# Patient Record
Sex: Female | Born: 1985
Health system: Southern US, Community
[De-identification: ages and names within clinical notes are randomized; demographics above are authoritative.]

---

## 2020-02-02 ENCOUNTER — Emergency Department (HOSPITAL_BASED_OUTPATIENT_CLINIC_OR_DEPARTMENT_OTHER)
Admission: EM | Admit: 2020-02-02 | Discharge: 2020-02-02 | Disposition: A | Discharge status: Home / Self Care | Payer: BC Managed Care – PPO | Attending: Emergency Medicine | Admitting: Emergency Medicine

## 2020-02-02 ENCOUNTER — Other Ambulatory Visit (HOSPITAL_BASED_OUTPATIENT_CLINIC_OR_DEPARTMENT_OTHER): Payer: Self-pay | Admitting: Physician Assistant

## 2020-02-02 ENCOUNTER — Emergency Department (HOSPITAL_BASED_OUTPATIENT_CLINIC_OR_DEPARTMENT_OTHER): Payer: BC Managed Care – PPO

## 2020-02-02 ENCOUNTER — Other Ambulatory Visit: Payer: Self-pay

## 2020-02-02 ENCOUNTER — Encounter (HOSPITAL_BASED_OUTPATIENT_CLINIC_OR_DEPARTMENT_OTHER): Payer: Self-pay | Admitting: *Deleted

## 2020-02-02 DIAGNOSIS — R059 Cough, unspecified: Secondary | ICD-10-CM | POA: Diagnosis not present

## 2020-02-02 MED ORDER — BENZONATATE 100 MG PO CAPS: 100.0000 mg | ORAL_CAPSULE | Freq: Three times a day (TID) | ORAL | 0 refills | Status: DC | PRN

## 2020-02-02 MED ORDER — PREDNISONE 10 MG PO TABS: 20.0000 mg | ORAL_TABLET | Freq: Every day | ORAL | 0 refills | Status: DC

## 2020-02-02 MED ORDER — ALBUTEROL SULFATE HFA 108 (90 BASE) MCG/ACT IN AERS
1.0000 | INHALATION_SPRAY | Freq: Once | RESPIRATORY_TRACT | Status: AC
  Administered 2020-02-02: 1 via RESPIRATORY_TRACT
  Filled 2020-02-02: qty 6.7

## 2020-02-02 MED ORDER — PREDNISONE 50 MG PO TABS
60.0000 mg | ORAL_TABLET | Freq: Once | ORAL | Status: AC
  Administered 2020-02-02: 60 mg via ORAL
  Filled 2020-02-02: qty 1

## 2020-02-02 MED FILL — predniSONE 10 MG TABS: 10 | 5 days supply | Qty: 10 | Fill #0

## 2020-02-02 MED FILL — BENZONATATE 100 MG CAPS: 100 | 7 days supply | Qty: 21 | Fill #0

## 2020-02-02 NOTE — Discharge Instructions (Signed)
Your chest x-ray did not show any signs of infection or pneumonia.  It is likely you have bronchitis as we discussed.  Prescription sent to your pharmacy for Tessalon.  This is a cough medicine that can help decrease coughing.  Take as needed.  Prescription also sent for a short burst of prednisone.  Take as prescribed.  Return to the emergency room you have any worsening symptoms.  Thank you for allowing [place] to care for you today.

## 2020-02-02 NOTE — ED Triage Notes (Signed)
Cough x 4 weeks. She has had 3 negative Covid test. The last test was 3 days ago.

## 2020-02-02 NOTE — ED Provider Notes (Signed)
[facility] EMERGENCY DEPARTMENT Provider Note   CSN: [id number] Arrival date & time: 02/02/20  1052     History Chief Complaint  Patient presents with  . Cough    Sandra Cole is a 34 y.o. female with self reported past medical history of asthma.  Had COVID vaccinations   HPI Patient presents to emergency department today with chief complaint of cough x4 weeks.  Patient states the cough first started after she went on a work trip.  She states the cough is dry nonproductive.  She has has tried over-the-counter medications without much improvement.  She states when she takes Mucinex it just makes her cough more.  She states she typically gets bronchitis every year.  She does state after she has a long coughing episode she will have sharp pain in her chest.  The chest pain is not worse with exertion.  She denies any associated shortness of breath or dyspnea on exertion.  She has had multiple negative Covid test, her last test was 3 days ago.  She states her asthma is typically well controlled.  She has noticed intermittent wheezing however denies being any yesterday or today.  She denies any fever, chills, congestion, sore throat, otalgia, abdominal pain, nausea emesis, rash.  Denies any sick contacts or known Covid exposures.       History reviewed. No pertinent past medical history.  There are no problems to display for this patient.   History reviewed. No pertinent surgical history.   OB History   No obstetric history on file.     No family history on file.  Social History   Tobacco Use  . Smoking status: Never Smoker  . Smokeless tobacco: Never Used  Substance Use Topics  . Alcohol use: Yes  . Drug use: Never    Home Medications Prior to Admission medications   Medication Sig Start Date End Date Taking? Authorizing Provider  benzonatate (TESSALON) 100 MG capsule Take 1 capsule (100 mg total) by mouth every 8 (eight) hours as needed for cough. 02/02/20    Walisiewicz, Yvonna Alanis E, PA-C  predniSONE (DELTASONE) 10 MG tablet Take 2 tablets (20 mg total) by mouth daily for 5 days. 02/03/20 02/08/20  Shanon Ace, PA-C    Allergies    Patient has no known allergies.  Review of Systems   Review of Systems All other systems are reviewed and are negative for acute change except as noted in the HPI.  Physical Exam Updated Vital Signs BP (!) 129/93 (BP Location: Right Arm)   Pulse 86   Temp 98.2 F (36.8 C) (Oral)   Resp 18   Ht 5\' 1"  (1.549 m)   Wt 63.5 kg   SpO2 100%   BMI 26.47 kg/m   Physical Exam Vitals and nursing note reviewed.  Constitutional:      Appearance: She is well-developed. She is not ill-appearing or toxic-appearing.     Comments: Dry hacking cough during exam  HENT:     Head: Normocephalic and atraumatic.     Comments: No sinus or temporal tenderness.    Nose: Nose normal.     Mouth/Throat:     Comments: No erythema to oropharynx, no edema, no exudate, no tonsillar swelling, voice normal, neck supple without lymphadenopathy  Eyes:     General: No scleral icterus.       Right eye: No discharge.        Left eye: No discharge.     Conjunctiva/sclera: Conjunctivae normal.  Neck:     Vascular: No JVD.  Cardiovascular:     Rate and Rhythm: Normal rate and regular rhythm.     Pulses: Normal pulses.     Heart sounds: Normal heart sounds.  Pulmonary:     Effort: Pulmonary effort is normal. No respiratory distress.     Breath sounds: Normal breath sounds. No wheezing, rhonchi or rales.     Comments: Oxygen saturation is 100% on room air.  She has normal work of breathing.  She is talking in full sentences. Chest:     Chest wall: No tenderness.  Abdominal:     General: There is no distension.  Musculoskeletal:        General: Normal range of motion.     Cervical back: Normal range of motion.  Skin:    General: Skin is warm and dry.  Neurological:     Mental Status: She is oriented to person, place,  and time.     GCS: GCS eye subscore is 4. GCS verbal subscore is 5. GCS motor subscore is 6.     Comments: Fluent speech, no facial droop.  Psychiatric:        Behavior: Behavior normal.     ED Results / Procedures / Treatments   Labs (all labs ordered are listed, but only abnormal results are displayed) Labs Reviewed - No data to display  EKG None  Radiology DG Chest Portable 1 View  Result Date: 02/02/2020 CLINICAL DATA:  Cough for 4 weeks, 3 negative COVID-19 tests, most recently 3 days ago EXAM: PORTABLE CHEST 1 VIEW COMPARISON:  Portable exam 1134 hours without priors for comparison FINDINGS: Normal heart size, mediastinal contours, and pulmonary vascularity. Lungs clear. No pleural effusion or pneumothorax. Bones unremarkable. IMPRESSION: No acute abnormalities. Electronically Signed   By: Ulyses Southward M.D.   On: 02/02/2020 12:08    Procedures Procedures (including critical care time)  Medications Ordered in ED Medications  albuterol (VENTOLIN HFA) 108 (90 Base) MCG/ACT inhaler 1 puff (1 puff Inhalation Given 02/02/20 1232)  predniSONE (DELTASONE) tablet 60 mg (60 mg Oral Given 02/02/20 1230)    ED Course  I have reviewed the triage vital signs and the nursing notes.  Pertinent labs & imaging results that were available during my care of the patient were reviewed by me and considered in my medical decision making (see chart for details).    MDM Rules/Calculators/A&P                          History provided by patient with additional history obtained from chart review.    -year-old female presenting with cough.  She is afebrile, hemodynamically stable, no acute distress.  She is well-appearing.  On exam she has dry hacking cough.  Her lungs are clear to auscultation all fields.  No wheezing heard.  No respiratory distress.  No signs of dehydration on exam.  I viewed pt's chest xray and it does not suggest acute infectious processes.  Low suspicion for underlying bacterial  illness. Work-up is suggestive of viral URI versus bronchitis.  Patient denies chance of pregnancy and politely refuses pregnancy test today.  Will treat for bronchitis with prescription for Tessalon pearls.  Will also cover for likelihood of asthma exacerbation with short burst of prednisone.  Patient had recent negative Covid test and wishes not to have another test performed today.  I feel this is reasonable. The patient appears reasonably screened and/or stabilized for discharge  and I doubt any other medical condition or other [facility] requiring further screening, evaluation, or treatment in the ED at this time prior to discharge. The patient is safe for discharge with strict return precautions discussed. Recommend pcp follow up.  Sandra Cole was evaluated in Emergency Department on 02/02/2020 for the symptoms described in the history of present illness. She was evaluated in the context of the global COVID-19 pandemic, which necessitated consideration that the patient might be at risk for infection with the SARS-CoV-2 virus that causes COVID-19. Institutional protocols and algorithms that pertain to the evaluation of patients at risk for COVID-19 are in a state of rapid change based on information released by regulatory bodies including the CDC and federal and state organizations. These policies and algorithms were followed during the patient's care in the ED.   Portions of this note were generated with Scientist, clinical (histocompatibility and immunogenetics). Dictation errors may occur despite best attempts at proofreading.    Final Clinical Impression(s) / ED Diagnoses Final diagnoses:  Cough    Rx / DC Orders ED Discharge Orders         Ordered    predniSONE (DELTASONE) 10 MG tablet  Daily        02/02/20 1239    benzonatate (TESSALON) 100 MG capsule  Every 8 hours PRN        02/02/20 1219           Shanon Ace, PA-C 02/02/20 1247    Milagros Loll, MD 02/03/20 [phone]

## 2022-03-16 IMAGING — DX DG CHEST 1V PORT
1 series · 1 of 1 positions shown · non-contrast
Comparison: Portable exam 4461 hours without priors for comparison

CLINICAL DATA: Cough for 4 weeks, 3 negative [id number] tests, most
recently 3 days ago

EXAM:
PORTABLE CHEST 1 VIEW

[chest ap]
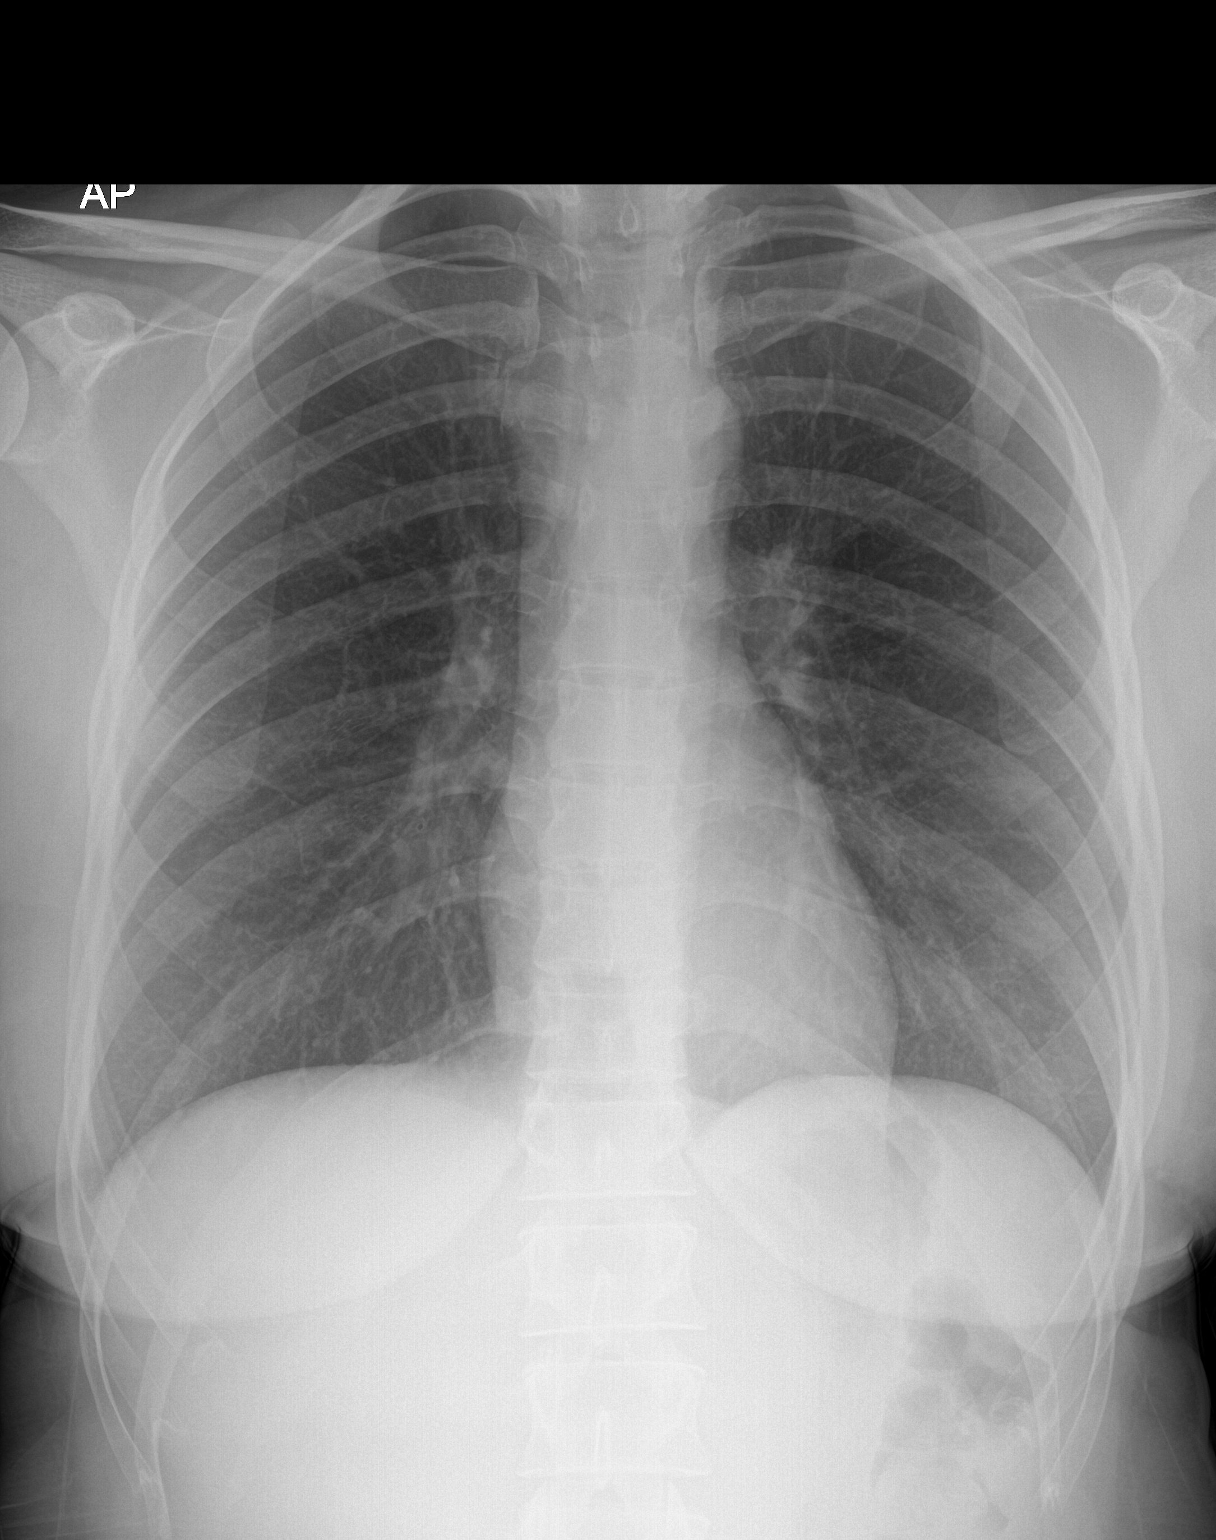

[1 of 1 positions shown; findings below may reference images not displayed]

FINDINGS: Normal heart size, mediastinal contours, and pulmonary vascularity.

Lungs clear.

No pleural effusion or pneumothorax.

Bones unremarkable.
IMPRESSION: No acute abnormalities.
